# Patient Record
Sex: Female | Born: 1990 | Race: White | Hispanic: No | Marital: Single
Health system: Southern US, Community
[De-identification: ages and names within clinical notes are randomized; demographics above are authoritative.]

---
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
## 2010-02-11 ENCOUNTER — Observation Stay: Payer: Self-pay | Admitting: Obstetrics and Gynecology

## 2010-02-12 ENCOUNTER — Inpatient Hospital Stay: Payer: Self-pay

## 2013-05-27 ENCOUNTER — Ambulatory Visit (INDEPENDENT_AMBULATORY_CARE_PROVIDER_SITE_OTHER): Payer: BC Managed Care – PPO | Admitting: Internal Medicine

## 2013-05-27 DIAGNOSIS — Z7184 Encounter for health counseling related to travel: Secondary | ICD-10-CM

## 2013-05-27 DIAGNOSIS — Z7189 Other specified counseling: Secondary | ICD-10-CM

## 2013-05-27 MED ORDER — AZITHROMYCIN 500 MG PO TABS: 500.0000 mg | ORAL_TABLET | Freq: Every day | ORAL | Status: AC

## 2013-05-27 MED ORDER — TYPHOID VACCINE PO CPDR: 1.0000 | DELAYED_RELEASE_CAPSULE | ORAL | Status: AC

## 2013-05-27 NOTE — Progress Notes (Signed)
  Subjective:    Lindsay Blanchard is a 23 y.o. female who presents to the Infectious Disease clinic for travel consultation. Planned departure date: January          Planned return date: 1 year [place]ries of travel[place] Areas in country: urban   Accommodations: private home Purpose of travel: business/ office work Prior travel out of [place]: no Currently ill / Fever: no History of liver or kidney disease: no  Data Review:  no medical problems   Review of Systems A comprehensive review of systems was negative.    Objective:    n/a    Assessment:    No contraindications to travel. none      Plan:    Issues discussed: environmental concerns, future shots, Japanese encephalitis, malaria, motion sickness, MVA safety, rabies, safe food/water, traveler's diarrhea, website/handouts for more information, what to do if ill upon return and what to do if ill while there. Immunizations recommended: Typhoid (oral). Malaria prophylaxis: not indicated Traveler's diarrhea prophylaxis: azithromycin.

## 2022-12-02 ENCOUNTER — Other Ambulatory Visit: Payer: Self-pay | Admitting: Student

## 2022-12-02 DIAGNOSIS — H539 Unspecified visual disturbance: Secondary | ICD-10-CM

## 2022-12-02 DIAGNOSIS — R519 Headache, unspecified: Secondary | ICD-10-CM

## 2022-12-02 DIAGNOSIS — R42 Dizziness and giddiness: Secondary | ICD-10-CM

## 2022-12-03 ENCOUNTER — Ambulatory Visit
Admission: RE | Admit: 2022-12-03 | Discharge: 2022-12-03 | Disposition: A | Discharge status: Home / Self Care | Payer: Managed Care, Other (non HMO) | Source: Ambulatory Visit | Attending: Student | Admitting: Student

## 2022-12-03 DIAGNOSIS — R42 Dizziness and giddiness: Secondary | ICD-10-CM | POA: Diagnosis present

## 2022-12-03 DIAGNOSIS — R519 Headache, unspecified: Secondary | ICD-10-CM | POA: Diagnosis present

## 2022-12-03 DIAGNOSIS — H539 Unspecified visual disturbance: Secondary | ICD-10-CM | POA: Insufficient documentation

## 2023-07-02 ENCOUNTER — Other Ambulatory Visit: Payer: Self-pay | Admitting: Internal Medicine

## 2023-07-02 DIAGNOSIS — R519 Headache, unspecified: Secondary | ICD-10-CM

## 2023-11-25 ENCOUNTER — Ambulatory Visit: Payer: Self-pay | Admitting: Dermatology

## 2024-02-17 ENCOUNTER — Encounter

## 2024-02-24 ENCOUNTER — Ambulatory Visit

## 2024-02-24 DIAGNOSIS — D225 Melanocytic nevi of trunk: Secondary | ICD-10-CM

## 2024-02-24 DIAGNOSIS — Z1283 Encounter for screening for malignant neoplasm of skin: Secondary | ICD-10-CM | POA: Diagnosis not present

## 2024-02-24 DIAGNOSIS — R21 Rash and other nonspecific skin eruption: Secondary | ICD-10-CM

## 2024-02-24 DIAGNOSIS — D229 Melanocytic nevi, unspecified: Secondary | ICD-10-CM

## 2024-02-24 NOTE — Progress Notes (Signed)
    Subjective   Lindsay Blanchard is a 33 y.o. female who presents for the following: Rash. Patient is new patient  Today patient reports: Patient states that she had a rash under her eyes and around her mouth that has resolved. She is currently using Triamcinolone and Mupirocin ointments.   She also has a lesion of concern on her groin that she is concerned about.   Review of Systems:    No other skin or systemic complaints except as noted in HPI or Assessment and Plan.  The following portions of the chart were reviewed this encounter and updated as appropriate: medications, allergies, medical history  Relevant Medical History:  Personal history of non melanoma skin cancer - see medical history for full details   Objective  Well appearing patient in no apparent distress; mood and affect are within normal limits. Examination was performed of the: Sun Exposed Exam: Scalp, head, eyes, ears, nose, lips, neck, upper extremities, hands, fingers, fingernails  Examination notable for: face clear without rash L mons/inguinal crease with pink/brown exophytic papule  Examination limited by: Clothing and Patient deferred removal       Assessment & Plan   SKIN CANCER SCREENING PERFORMED TODAY.  BENIGN SKIN FINDINGS  - Dermal nevus at the groin - Reassurance provided regarding the benign appearance of lesions noted on exam today; no treatment is indicated in the absence of symptoms/changes. - Reinforced importance of photoprotective strategies including liberal and frequent sunscreen use of a broad-spectrum SPF 30 or greater, use of protective clothing, and sun avoidance for prevention of cutaneous malignancy and photoaging.  Counseled patient on the importance of regular self-skin monitoring as well as routine clinical skin examinations as scheduled.   Intermittent pruritic eruption around eyes, occasionally around mouth - clear today-  ddx allergic contact dermatitis vs periorificial  dermatitis - rash not present at time of visit  - Undiagnosed new problem with uncertain prognosis  - Differential diagnosis, treatment options, prognosis, risk/ benefit, and side effects of treatment were discussed with the patient.  - Dx'd by PCP and treated with Triamcinolone and Mupirocin PRN. - Advised to send photos in next time rash occurs and can determine best tx     Level of service outlined above   Procedures, orders, diagnosis for this visit:    There are no diagnoses linked to this encounter.  Return to clinic: Return if symptoms worsen or fail to improve.  Documentation: I have reviewed the above documentation for accuracy and completeness, and I agree with the above.  Lauraine JAYSON Kanaris, MD

## 2024-02-24 NOTE — Patient Instructions (Addendum)
 Steroid Use  We prescribed you a topical steroid at today's visit.   General application instructions: -Apply this to any affected skin areas, twice (2 times) daily, for two (2) weeks -If the areas are better, you can stop -Re-start the topical steroid if the areas come back, or flare -If the areas don't get better after two weeks, we sometimes recommend taking a break for one (1) week, before restarting for another two (2) weeks. Repeat as needed  The most common side effects of topical steroid medications include changes in skin pigment and thinning of the skin. If the steroid is only applied to affected areas of the skin, these effects rarely occur unless the steroid is used for a very long time (years without stopping).   If we prescribed you a strong steroid, please avoid applying to face, groin, or neck, unless we tell you otherwise. We will include more detail in your prescription instructions.    Due to recent changes in healthcare laws, you may see results of your pathology and/or laboratory studies on MyChart before the doctors have had a chance to review them. We understand that in some cases there may be results that are confusing or concerning to you. Please understand that not all results are received at the same time and often the doctors may need to interpret multiple results in order to provide you with the best plan of care or course of treatment. Therefore, we ask that you please give us  2 business days to thoroughly review all your results before contacting the office for clarification. Should we see a critical lab result, you will be contacted sooner.   If You Need Anything After Your Visit  If you have any questions or concerns for your doctor, please call our main line at [phone] and press option 4 to reach your doctor's medical assistant. If no one answers, please leave a voicemail as directed and we will return your call as soon as possible. Messages left after  4 pm will be answered the following business day.   You may also send us  a message via MyChart. We typically respond to MyChart messages within 1-2 business days.  For prescription refills, please ask your pharmacy to contact our office. Our fax number is [phone].  If you have an urgent issue when the clinic is closed that cannot wait until the next business day, you can page your doctor at the number below.    Please note that while we do our best to be available for urgent issues outside of office hours, we are not available 24/7.   If you have an urgent issue and are unable to reach us , you may choose to seek medical care at your doctor's office, retail clinic, urgent care center, or emergency room.  If you have a medical emergency, please immediately call 911 or go to the emergency department.  Pager Numbers  - Dr. Hester: [phone]  - Dr. Jackquline: [phone]  - Dr. Claudene: [phone]   - Dr. Raymund: [phone]  In the event of inclement weather, please call our main line at [phone] for an update on the status of any delays or closures.  Dermatology Medication Tips: Please keep the boxes that topical medications come in in order to help keep track of the instructions about where and how to use these. Pharmacies typically print the medication instructions only on the boxes and not directly on the medication tubes.   If your medication is too expensive, please contact our  office at [phone] option 4 or send us  a message through MyChart.   We are unable to tell what your co-pay for medications will be in advance as this is different depending on your insurance coverage. However, we may be able to find a substitute medication at lower cost or fill out paperwork to get insurance to cover a needed medication.   If a prior authorization is required to get your medication covered by your insurance company, please allow us  1-2 business days to complete this  process.  Drug prices often vary depending on where the prescription is filled and some pharmacies may offer cheaper prices.  The website www.goodrx.com contains coupons for medications through different pharmacies. The prices here do not account for what the cost may be with help from insurance (it may be cheaper with your insurance), but the website can give you the price if you did not use any insurance.  - You can print the associated coupon and take it with your prescription to the pharmacy.  - You may also stop by our office during regular business hours and pick up a GoodRx coupon card.  - If you need your prescription sent electronically to a different pharmacy, notify our office through [facility] or by phone at [phone] option 4.     Si Usted Necesita Algo Despus de Su Visita  Tambin puede enviarnos un mensaje a travs de Clinical cytogeneticist. Por lo general respondemos a los mensajes de MyChart en el transcurso de 1 a 2 das hbiles.  Para renovar recetas, por favor pida a su farmacia que se ponga en contacto con nuestra oficina. Randi lakes de fax es [place] [phone].  Si tiene un asunto urgente cuando la clnica est cerrada y que no puede esperar hasta el siguiente da hbil, puede llamar/localizar a su doctor(a) al nmero que aparece a continuacin.   Por favor, tenga en cuenta que aunque hacemos todo lo posible para estar disponibles para asuntos urgentes fuera del horario de [place], no estamos disponibles las 24 horas del da, los 7 [address]  [address] de la [place].   Si tiene un problema urgente y no puede comunicarse con nosotros, puede optar por buscar atencin mdica  en el consultorio de su doctor(a), en una clnica privada, en un centro de atencin urgente o en una sala de emergencias.  Si tiene Engineer, drilling, por favor llame inmediatamente al 911 o vaya a la sala de emergencias.  Nmeros de bper  - Dr. Hester: [phone]  - Dra. Jackquline: [phone]  - Dr.  Claudene: [phone]  - Dra. Kitts: [phone]  En caso de inclemencias del [place], por favor llame a nuestra lnea principal al [phone] para una actualizacin sobre el estado de cualquier retraso o cierre.  Consejos para la medicacin en dermatologa: Por favor, guarde las cajas en las que vienen los medicamentos de uso tpico para ayudarle a seguir las instrucciones sobre dnde y cmo usarlos. Las farmacias generalmente imprimen las instrucciones del medicamento slo en las cajas y no directamente en los tubos del [place].   Si su medicamento es muy caro, por favor, pngase en contacto con landry rieger llamando al [phone] y presione la opcin 4 o envenos un mensaje a travs de Clinical cytogeneticist.   No podemos decirle cul ser su copago por los medicamentos por adelantado ya que esto es diferente dependiendo de la cobertura de su seguro. Sin embargo, es posible que podamos encontrar un medicamento sustituto a Audiological scientist un formulario para que el  seguro cubra el medicamento que se considera necesario.   Si se requiere una autorizacin previa para que su compaa de seguros malta su medicamento, por favor permtanos de 1 a 2 das hbiles para completar este proceso.  Los precios de los medicamentos varan con frecuencia dependiendo del Environmental consultant de dnde se surte la receta y alguna farmacias pueden ofrecer precios ms baratos.  El sitio web www.goodrx.com tiene cupones para medicamentos de Health and safety inspector. Los precios aqu no tienen en cuenta lo que podra costar con la ayuda del seguro (puede ser ms barato con su seguro), pero el sitio web puede darle el precio si no utiliz Tourist information centre manager.  - Puede imprimir el cupn correspondiente y llevarlo con su receta a la farmacia.  - Tambin puede pasar por nuestra oficina durante el horario de atencin regular y Education officer, museum una tarjeta de cupones de GoodRx.  - Si necesita que su receta se enve electrnicamente a una farmacia diferente,  informe a nuestra oficina a travs de MyChart de [place] o por telfono llamando al [phone] y presione la opcin 4.

## 2024-03-02 MED ORDER — METRONIDAZOLE 1 % EX GEL: Freq: Every day | CUTANEOUS | 3 refills | Status: AC

## 2024-03-17 MED ORDER — PIMECROLIMUS 1 % EX CREA: TOPICAL_CREAM | Freq: Two times a day (BID) | CUTANEOUS | 0 refills | Status: AC
